# Patient Record
Sex: Male | Born: 1980 | Race: White | Hispanic: No | Marital: Single | Smoking: Never smoker
Health system: Southern US, Community
[De-identification: ages and names within clinical notes are randomized; demographics above are authoritative.]

---
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
## 2003-07-11 ENCOUNTER — Encounter: Payer: Self-pay | Admitting: Emergency Medicine

## 2003-07-11 ENCOUNTER — Emergency Department (HOSPITAL_COMMUNITY): Admission: EM | Admit: 2003-07-11 | Discharge: 2003-07-12 | Payer: Self-pay | Admitting: Emergency Medicine

## 2011-04-25 ENCOUNTER — Ambulatory Visit (HOSPITAL_COMMUNITY): Payer: 59 | Admitting: Psychiatry

## 2011-04-25 DIAGNOSIS — F329 Major depressive disorder, single episode, unspecified: Secondary | ICD-10-CM

## 2011-04-25 NOTE — Group Therapy Note (Signed)
NAMEESMOND, HINCH NO.:  [id number]  MEDICAL RECORD NO.:  [id number]  LOCATION:  [facility]                           FACILITY:  [facility]  PHYSICIAN:  Jhalen Eley T. Chin Wachter, M.D.   DATE OF BIRTH:  1981/08/08                                PROGRESS NOTE  Date of Service; 04/25/11 Patient is 30 year old Caucasian single employed man who is self- referred for seeking treatment.  The patient endorsed for past 2 years he has been feeling more depressed, tired with decreased energy and decreased concentration.  He has been taking Lexapro for past 12 years, and he feels that his current medicine is not working very well.  He told that he has been sleeping too much, and has no motivation to do anything.  Two weeks ago he attended a party in [place], and he told that all Saturday he was sleeping and did not do anything.  He admitted that he has chronic stressors.  Is working as a Airline pilot person at [employer], and that job can be stressful and difficult to achieve unrealistic goals.  However, he has no issue at the job.  Lately he has been noticing that when he comes home he feels disconnected.  Does not participate time with the family, and sometimes lying with the parents when they ask his future plans.  He denies any agitation, anger, mood swing, but told anxiety and nervousness and racing thoughts.  The patient also very self-conscious about his increased weight.  However, he remembered his weight has always been on a higher side as far as he remembers.  The patient denies any mania, hypomania, anger, psychosis or any hallucination.  He denies any active or passive suicidal thoughts. He reported no side effects of Lexapro, but he feels that his Lexapro is not working very well.  PAST PSYCHIATRIC HISTORY: Patient told that he has been taking Lexapro for past 12 years which was started by his primary care doctor when he endorsed decreased energy and decreased concentration.  He  denies any previous psychiatric inpatient treatment or previous suicidal attempt.  He has seen psychiatrist 4 years ago in [place], Dr. Piedad Climes, but recently his primary care doctor has been managing his medication.  FAMILY HISTORY: Patient endorsed both his parents are depressed.  His father takes Effexor, but he does not remember his mother's antidepressant.  PSYCHOSOCIAL HISTORY: Patient was born in [place], [place].  However, family moved to different states due to father's job.  For past 6 years they have been living in [place].  Patient denies any history of physical, sexual, verbal or emotional abuse.  He is currently in a relationship for past 4 months, though he is very cautious and tense about his relationship due to ongoing his depression and afraid that he may lie to his girlfriend also.  Patient has no children.  He is living with his parents.  LEGAL HISTORY: The patient endorsed recently he was accused for identity theft charges which is, however, now dismissed, but that caused a lot of embarrassment in his life.  Patient told that his previous girlfriend accuse him for these charges when both of them are  trying to fix the house.  Patient told that charge has recently caused a lot of stress in his life.  EDUCATION/WORK HISTORY: Patient has high school with 3 years of college.  He has been currently working as a Immunologist at [employer].  ALCOHOL AND SUBSTANCE ABUSE HISTORY: Patient denies any illegal substances.  However, endorsed history of drinking on social occasion.  His last drink was 2 weeks ago when he attended a wedding in [place].  Patient admitted history of intoxication, but denies any history of tremors, seizures, blackout or any withdrawal symptoms.  MEDICAL HISTORY: Patient does not have any physical or lab work in past 4 years.  His primary care doctor is Dr. Elvis Coil.  Recently he saw his primary care doctor for cold  symptoms.  He never had lab test for thyroid studies.  He is moderately obese.  His weight is 297.  MENTAL STATUS EXAMINATION: Patient is very pleasant obese man who is well-groomed, well-dressed. Male good eye contact.  His speech is soft, clear, and coherent.  His thought process was logical, linear, and goal-directed.  He described his mood as anxious and depressed, and affect was constricted.  However, he denies any active or passive suicidal thinking, homicidal thinking or any hallucination.  There was no psychosis present.  There was no delusions or obsession present.  His attention and concentration were okay.  His fund of knowledge was good.  He is alert and oriented x3. His insight, judgment, impulse control were okay.  DIAGNOSES: AXIS I:  Depressive disorder not otherwise specified. AXIS II:  Deferred. AXIS III:  Obesity. AXIS IV:  Mild to moderate. AXIS V:  65-70   PLAN: At this time patient is feeling that his current medicine is not working.  We talked about adding the Wellbutrin, and gradually tapering the Lexapro and just continuing Wellbutrin on a regular basis.  We will start the Wellbutrin 150 along with the Lexapro for now.  I explained the risks and benefits of medication in detail especially no alcohol with the psychotropic medication.  We explained the side effects, dependency and interaction with alcohol and psychotropic medication.  I also talked to him to get basic lab studies including the TSH to rule out any organic pathology.  We also talked about seeing a therapist for increased coping and social skills which he agreed.  We will schedule the appointment with a therapist in this office.  I will see him again in 2 weeks.  We talked about safety plan, and recommended to call us or go to local ER or call 911 immediately if he started to feel worsening of the symptoms or anytime having suicidal thinking or homicidal thinking which he acknowledged.  I will  see him again in 2 weeks.     Sandy Blouch T. Lolly Mustache, M.D.     STA/MEDQ  D:  04/25/2011  T:  04/25/2011  Job:  [id number]  Electronically Signed by Kathryne Sharper M.D. on 04/25/2011 04:36:10 PM

## 2011-05-07 ENCOUNTER — Ambulatory Visit (HOSPITAL_COMMUNITY): Payer: Self-pay | Admitting: Psychiatry

## 2011-05-09 ENCOUNTER — Encounter (HOSPITAL_COMMUNITY): Payer: 59 | Admitting: Psychiatry

## 2011-05-09 DIAGNOSIS — F329 Major depressive disorder, single episode, unspecified: Secondary | ICD-10-CM

## 2011-05-23 ENCOUNTER — Ambulatory Visit (HOSPITAL_COMMUNITY): Payer: 59 | Admitting: Licensed Clinical Social Worker

## 2011-05-24 DIAGNOSIS — F329 Major depressive disorder, single episode, unspecified: Secondary | ICD-10-CM

## 2011-06-06 ENCOUNTER — Encounter (HOSPITAL_COMMUNITY): Payer: 59 | Admitting: Psychiatry

## 2011-06-22 ENCOUNTER — Emergency Department (HOSPITAL_COMMUNITY)
Admission: EM | Admit: 2011-06-22 | Discharge: 2011-06-22 | Disposition: A | Discharge status: Other Acute Inpatient Hospital | Payer: Self-pay | Attending: Emergency Medicine | Admitting: Emergency Medicine

## 2011-06-22 ENCOUNTER — Inpatient Hospital Stay (HOSPITAL_COMMUNITY)
Admission: RE | Admit: 2011-06-22 | Discharge: 2011-06-26 | DRG: 885 | Disposition: A | Discharge status: Home / Self Care | Payer: PRIVATE HEALTH INSURANCE | Attending: Psychiatry | Admitting: Psychiatry

## 2011-06-22 DIAGNOSIS — F329 Major depressive disorder, single episode, unspecified: Secondary | ICD-10-CM | POA: Insufficient documentation

## 2011-06-22 DIAGNOSIS — F3289 Other specified depressive episodes: Secondary | ICD-10-CM | POA: Insufficient documentation

## 2011-06-22 DIAGNOSIS — F339 Major depressive disorder, recurrent, unspecified: Principal | ICD-10-CM

## 2011-06-22 DIAGNOSIS — F411 Generalized anxiety disorder: Secondary | ICD-10-CM

## 2011-06-22 DIAGNOSIS — E663 Overweight: Secondary | ICD-10-CM

## 2011-06-22 DIAGNOSIS — F429 Obsessive-compulsive disorder, unspecified: Secondary | ICD-10-CM

## 2011-06-22 DIAGNOSIS — R45851 Suicidal ideations: Secondary | ICD-10-CM

## 2011-06-22 LAB — RAPID URINE DRUG SCREEN, HOSP PERFORMED
Amphetamines: NOT DETECTED
Barbiturates: NOT DETECTED
Tetrahydrocannabinol: NOT DETECTED

## 2011-06-22 LAB — BASIC METABOLIC PANEL
BUN: 17 mg/dL (ref 6–23)
Chloride: 104 mEq/L (ref 96–112)
GFR calc non Af Amer: >60 mL/min (ref >60)
Potassium: 3.8 mEq/L (ref 3.5–5.1)

## 2011-06-22 LAB — CBC
HCT: 46.4 % (ref 39.0–52.0)
Hemoglobin: 15.8 g/dL (ref 13.0–17.0)
MCH: 28.2 pg (ref 26.0–34.0)
MCHC: 34.1 g/dL (ref 30.0–36.0)
MCV: 82.9 fL (ref 78.0–100.0)
Platelets: 237 10*3/uL (ref 150–400)
WBC: 7 10*3/uL (ref 4.0–10.5)

## 2011-06-22 LAB — ETHANOL: Alcohol, Ethyl (B): <11 mg/dL (ref 0–11)

## 2011-06-23 DIAGNOSIS — F339 Major depressive disorder, recurrent, unspecified: Secondary | ICD-10-CM

## 2011-06-23 DIAGNOSIS — F411 Generalized anxiety disorder: Secondary | ICD-10-CM

## 2011-06-23 DIAGNOSIS — F429 Obsessive-compulsive disorder, unspecified: Secondary | ICD-10-CM

## 2011-06-25 NOTE — Assessment & Plan Note (Signed)
NAMEETTORE, TREBILCOCK NO.:  [id number]  MEDICAL RECORD NO.:  [id number]  LOCATION:  0505                          FACILITY:  [facility]  PHYSICIAN:  Franchot Gallo, MD     DATE OF BIRTH:  11/15/1980  DATE OF ADMISSION:  06/22/2011 DATE OF DISCHARGE:                      PSYCHIATRIC ADMISSION ASSESSMENT   This is a 30 year old male who was voluntarily admitted on June 22, 2011.  HISTORY OF PRESENT ILLNESS:  The patient states he is here feeling very "overwhelmed".  He states that he has a history of compulsive lying, that it gets worse when he is depressed.  He feels that it has gotten out of control.  He is feeling very, very guilty, increasing anxiety, having suicidal thoughts this past week, passive suicidal thoughts, wanting not to be a burden.  He has not been sleeping well at night, having problems with self-esteem.  He reports some OCD tendencies, counting.  He feels that his medications are not as effective.  The patient is also endorsing mood swings.  He states when he feels depressed he does not feel very low.  PAST PSYCHIATRIC HISTORY:  First admission to [facility] He sees Dr. Lolly Mustache in [place] and was able to see Hassan Rowan for therapy but financially was unable to continue.  Has been on Lexapro and Wellbutrin in the past but also was having some financial problems with his medications.  SOCIAL HISTORY:  The patient is single.  He lives with his parents.  He states his mother is supportive.  His father is not.  States he does not believe in mental health problems.  FAMILY HISTORY:  None.  ALCOHOL AND DRUG HISTORY:  No alcohol or substance use.  PRIMARY CARE PROVIDER:  None.  MEDICAL PROBLEMS:  The patient denies any acute or chronic health issues.  MEDICATIONS:  He has been on Lexapro 20 mg, Wellbutrin 150 mg.  DRUG ALLERGIES:  None.  PHYSICAL EXAM:  This is an overweight young male, normally developed, no acute  distress.  He offers no complaints.  His glucose is 114.  Alcohol level less than 11.  CBC within normal limits.  Urine drug screen is negative.  MENTAL STATUS EXAM:  He is fully alert and cooperative, casually dressed, dressing in his own clothing.  He was seen with Dr. Lolly Mustache who he knows well.  Speech is clear.  Normal pace and tone.  Mood is guilty, depressed and anxious.  He does appear somewhat anxious, very pleasant and cooperative, agreeable to recommendations.  He denies any suicidal thoughts today.  Denies any hallucinations.  Cognitive function intact. His memory appears intact.  Judgment and insight appear to be good.  DIAGNOSES:  AXIS I:  Major depressive disorder rule out obsessive- compulsive disorder, mood disorder not otherwise specified. AXIS II:  Deferred. AXIS III:  No known medical conditions. AXIS IV:  Other psychosocial problems, self-esteem, chronic mental illness. AXIS V:  Current is 35.  PLAN:  To continue with the Lexapro and will add Risperdal for mood stabilization and to decrease possible ruminations.  The patient may benefit from some individual therapy.  His tentative length of stay at this time is 3-5 days.  Landry Corporal, N.P.   ______________________________ Franchot Gallo, MD    JO/MEDQ  D:  06/23/2011  T:  06/23/2011  Job:  [id number]  Electronically Signed by Limmie PatriciaP. on 06/25/2011 09:13:17 AM Electronically Signed by Franchot Gallo MD on 06/25/2011 06:34:25 PM

## 2011-06-26 LAB — TESTOSTERONE: Testosterone: 297.31 ng/dL (ref 250–890)

## 2011-06-26 LAB — TSH: TSH: 2.355 u[IU]/mL (ref 0.350–4.500)

## 2011-06-26 LAB — T3, FREE: T3, Free: 3.4 pg/mL (ref 2.3–4.2)

## 2011-06-27 LAB — SEX HORMONE BINDING GLOBULIN: Sex Hormone Binding: 17 nmol/L (ref 13–71)

## 2011-06-27 NOTE — Discharge Summary (Signed)
  NAMEDEIVI, HUCKINS NO.:  [id number]  MEDICAL RECORD NO.:  [id number]  LOCATION:  0505                          FACILITY:  [facility]  PHYSICIAN:  Franchot Gallo, MD     DATE OF BIRTH:  [birth year]  DATE OF ADMISSION:  06/22/2011 DATE OF DISCHARGE:  06/26/2011                              DISCHARGE SUMMARY   REASON FOR ADMISSION:  A 30 year old male that was admitted feeling very "overwhelmed."  He had a history of compulsive lying.  He states that this gets worse when he is depressed.  He feels he has gotten out of control, feeling very guilty, having increased anxiety suicidal thoughts, passive suicidal thoughts, wanting not to be a burden to his parents.  He also has a history of OCD tendencies.  FINAL IMPRESSION:  AXIS I:  Major depressive disorder recurrent good control.  Generalized anxiety disorder and OCD. AXIS II: Deferred. AXIS III:  No acute illnesses reported. AXIS IV: Chronic mental health issues. AXIS V: His GAF on discharge is 75.  PERTINENT LABS:  Glucose 114.  Alcohol level less than 11.  CBC within normal limits.  Urine drug screen is negative.  SIGNIFICANT FINDINGS:  The patient was admitted to the adult milieu.  We initially continued his Lexapro and added Risperdal for mood stabilization and decreased ruminations.  The patient was active and participating in groups.  We had contact the patient's mother who reported that the patient was not suicidal, that he does have history of chronic lying and court date on August 27, for the 3 felony charges.   The patient's initially felt very exhausted from his medications although he slept well.  He rated his depression 5.  He denied any psychotic symptoms.  We discontinued his Lexapro as he felt it was ineffective and started him on Prozac.  The patient reported sleeping good.  His appetite was good, having no suicidal thoughts  reporting depression a  4 on a scale of 1-10.  He has a history of  OCD. We increased his Prozac to 20 mg and obtained further labs.  On day of discharge the patient's sleep was good.  His appetite was good.  His depression had resolved rating it 0 on a scale of 1-10, adamantly denied any suicidal or homicidal thoughts or psychotic symptoms.  DISCHARGE MEDICATIONS: 1. Prozac 20 one tablet daily. 2. Risperdal 0.5 mg 1 b.i.d. 3. The patient was stop taking his Lexapro. 4. Testosterone Gel 5 grams applied daily to abdomen.  His follow-up appointment [facility] at [phone] on Tuesday on August 21 and with Dr. Lolly Mustache on Monday September 17 at 2:15.     Landry Corporal, N.P.   ______________________________ Franchot Gallo, MD    JO/MEDQ  D:  06/26/2011  T:  06/27/2011  Job:  [id number]  Electronically Signed by Limmie PatriciaP. on 06/27/2011 09:06:35 AM Electronically Signed by Franchot Gallo MD on 06/27/2011 04:05:44 PM

## 2011-07-30 ENCOUNTER — Encounter (HOSPITAL_COMMUNITY): Payer: PRIVATE HEALTH INSURANCE | Admitting: Psychiatry

## 2011-07-31 ENCOUNTER — Encounter (HOSPITAL_COMMUNITY): Payer: Self-pay | Admitting: Psychiatry

## 2011-10-19 ENCOUNTER — Ambulatory Visit (HOSPITAL_COMMUNITY): Payer: PRIVATE HEALTH INSURANCE | Admitting: Psychiatry

## 2018-12-11 ENCOUNTER — Ambulatory Visit: Payer: Self-pay | Admitting: Internal Medicine

## 2018-12-11 DIAGNOSIS — Z0289 Encounter for other administrative examinations: Secondary | ICD-10-CM

## 2020-01-30 ENCOUNTER — Other Ambulatory Visit: Payer: Self-pay

## 2020-01-30 ENCOUNTER — Encounter (HOSPITAL_COMMUNITY): Payer: Self-pay | Admitting: Emergency Medicine

## 2020-01-30 ENCOUNTER — Emergency Department (HOSPITAL_COMMUNITY)
Admission: EM | Admit: 2020-01-30 | Discharge: 2020-01-31 | Disposition: A | Discharge status: Home / Self Care | Payer: Self-pay | Attending: Emergency Medicine | Admitting: Emergency Medicine

## 2020-01-30 DIAGNOSIS — K55069 Acute infarction of intestine, part and extent unspecified: Secondary | ICD-10-CM | POA: Insufficient documentation

## 2020-01-30 MED ORDER — SODIUM CHLORIDE 0.9% FLUSH
3.0000 mL | Freq: Once | INTRAVENOUS | Status: AC
  Administered 2020-01-31: 05:00:00 3 mL via INTRAVENOUS

## 2020-01-30 NOTE — ED Triage Notes (Signed)
Patient reports right lateral abdominal pain for 2 days , denies emesis or diarrhea , no fever or chills .

## 2020-01-31 ENCOUNTER — Emergency Department (HOSPITAL_COMMUNITY): Payer: Self-pay

## 2020-01-31 LAB — CBC
HCT: 45.7 % (ref 39.0–52.0)
Hemoglobin: 14 g/dL (ref 13.0–17.0)
MCH: 24.9 pg — ABNORMAL LOW (ref 26.0–34.0)
MCHC: 30.6 g/dL (ref 30.0–36.0)
MCV: 81.3 fL (ref 80.0–100.0)
Platelets: 347 10*3/uL (ref 150–400)
RBC: 5.62 MIL/uL (ref 4.22–5.81)
RDW: 15.4 % (ref 11.5–15.5)
WBC: 9.6 10*3/uL (ref 4.0–10.5)
nRBC: 0 % (ref 0.0–0.2)

## 2020-01-31 LAB — COMPREHENSIVE METABOLIC PANEL
ALT: 44 U/L (ref 0–44)
AST: 28 U/L (ref 15–41)
Albumin: 3.9 g/dL (ref 3.5–5.0)
Alkaline Phosphatase: 49 U/L (ref 38–126)
Anion gap: 10 (ref 5–15)
BUN: 17 mg/dL (ref 6–20)
CO2: 25 mmol/L (ref 22–32)
Calcium: 9 mg/dL (ref 8.9–10.3)
Chloride: 103 mmol/L (ref 98–111)
Creatinine, Ser: 0.99 mg/dL (ref 0.61–1.24)
GFR calc Af Amer: >60 mL/min (ref >60)
GFR calc non Af Amer: >60 mL/min (ref >60)
Glucose, Bld: 108 mg/dL — ABNORMAL HIGH (ref 70–99)
Potassium: 3.5 mmol/L (ref 3.5–5.1)
Sodium: 138 mmol/L (ref 135–145)
Total Bilirubin: 0.7 mg/dL (ref 0.3–1.2)
Total Protein: 7.5 g/dL (ref 6.5–8.1)

## 2020-01-31 LAB — URINALYSIS, ROUTINE W REFLEX MICROSCOPIC
Bacteria, UA: NONE SEEN
Bilirubin Urine: NEGATIVE
Glucose, UA: NEGATIVE mg/dL
Hgb urine dipstick: NEGATIVE
Ketones, ur: NEGATIVE mg/dL
Nitrite: NEGATIVE
Protein, ur: NEGATIVE mg/dL
Specific Gravity, Urine: 1.014 (ref 1.005–1.030)
pH: 6 (ref 5.0–8.0)

## 2020-01-31 LAB — LIPASE, BLOOD: Lipase: 33 U/L (ref 11–51)

## 2020-01-31 MED ORDER — KETOROLAC TROMETHAMINE 30 MG/ML IJ SOLN
30.0000 mg | Freq: Once | INTRAMUSCULAR | Status: AC
  Administered 2020-01-31: 30 mg via INTRAVENOUS
  Filled 2020-01-31: qty 1

## 2020-01-31 MED ORDER — CEFTRIAXONE SODIUM 500 MG IJ SOLR
500.0000 mg | Freq: Once | INTRAMUSCULAR | Status: DC
  Filled 2020-01-31: qty 500

## 2020-01-31 MED ORDER — STERILE WATER FOR INJECTION IJ SOLN
INTRAMUSCULAR | Status: AC
  Filled 2020-01-31: qty 10

## 2020-01-31 NOTE — Discharge Instructions (Addendum)
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  This is usually self-limiting and can be managed at home with pain medication such as Tylenol and Motrin.    If your symptoms change or worsen, please return to the ER.

## 2020-01-31 NOTE — ED Provider Notes (Signed)
[facility] EMERGENCY DEPARTMENT Provider Note   CSN: [id number] Arrival date & time: 01/30/20  2319     History Chief Complaint  Patient presents with  . Abdominal Pain    Zachary Glover is a 39 y.o. male.  Patient presents to the emergency department with a chief complaint of right-sided flank pain.  He states that the symptoms started about 2 days ago.  He states that the pain waxes and wanes in severity.  He denies any pain with changes in position.  However, he states that he has been unable to find a comfortable position.  He denies any fever, chills, nausea, vomiting.  Reports having normal bowel movements and denies any dysuria or hematuria.  Denies any history of kidney stones.  Denies any prior abdominal surgeries.  The history is provided by the patient. No language interpreter was used.       History reviewed. No pertinent past medical history.  There are no problems to display for this patient.   History reviewed. No pertinent surgical history.     No family history on file.  Social History   Tobacco Use  . Smoking status: Never Smoker  . Smokeless tobacco: Never Used  Substance Use Topics  . Alcohol use: Yes  . Drug use: Never    Home Medications Prior to Admission medications   Not on File    Allergies    Patient has no known allergies.  Review of Systems   Review of Systems  All other systems reviewed and are negative.   Physical Exam Updated Vital Signs BP (!) 164/103   Pulse (!) 112   Temp 98.7 F (37.1 C) (Oral)   Resp 16   Ht 5\' 11"  (1.803 m)   Wt (!) 160 kg   SpO2 97%   BMI 49.20 kg/m   Physical Exam Vitals and nursing note reviewed.  Constitutional:      Appearance: He is well-developed.  HENT:     Head: Normocephalic and atraumatic.  Eyes:     Conjunctiva/sclera: Conjunctivae normal.  Cardiovascular:     Rate and Rhythm: Normal rate and regular rhythm.     Heart sounds: No murmur.  Pulmonary:       Effort: Pulmonary effort is normal. No respiratory distress.     Breath sounds: Normal breath sounds.  Abdominal:     Palpations: Abdomen is soft.     Tenderness: There is no abdominal tenderness.  Musculoskeletal:        General: Normal range of motion.     Cervical back: Neck supple.  Skin:    General: Skin is warm and dry.  Neurological:     General: No focal deficit present.     Mental Status: He is alert and oriented to person, place, and time.  Psychiatric:        Mood and Affect: Mood normal.        Behavior: Behavior normal.     ED Results / Procedures / Treatments   Labs (all labs ordered are listed, but only abnormal results are displayed) Labs Reviewed  COMPREHENSIVE METABOLIC PANEL - Abnormal; Notable for the following components:      Result Value   Glucose, Bld 108 (*)    All other components within normal limits  CBC - Abnormal; Notable for the following components:   MCH 24.9 (*)    All other components within normal limits  URINALYSIS, ROUTINE W REFLEX MICROSCOPIC - Abnormal; Notable for the  following components:   Leukocytes,Ua TRACE (*)    All other components within normal limits  LIPASE, BLOOD    EKG None  Radiology CT Renal Stone Study  Result Date: 01/31/2020 CLINICAL DATA:  Initial evaluation for acute right abdominal pain, stone suspected. EXAM: CT ABDOMEN AND PELVIS WITHOUT CONTRAST TECHNIQUE: Multidetector CT imaging of the abdomen and pelvis was performed following the standard protocol without IV contrast. COMPARISON:  None available. FINDINGS: Lower chest: Mild scattered subsegmental atelectatic changes seen within the visualized lung bases. Visualized lungs are otherwise clear. Hepatobiliary: Diffuse hypoattenuation liver consistent with steatosis. Liver otherwise unremarkable. Gallbladder within normal limits. No biliary dilatation. Pancreas: Pancreas within normal limits. Spleen: Spleen within normal limits. Adrenals/Urinary Tract:  Adrenal glands are normal. Kidneys equal in size without nephrolithiasis or hydronephrosis. No radiopaque calculi seen along the course of either renal collecting system. No hydroureter. Partially distended bladder within normal limits. No layering stones within the bladder lumen. Stomach/Bowel: Stomach within normal limits. No evidence for bowel obstruction. There is hazy inflammatory fat stranding seen adjacent to the ascending colon within the right abdomen, immediately adjacent to the inferior aspect of the liver (series 6, image 58). The adjacent ascending colon is relatively normal in appearance without associated wall thickening or other abnormality. Appendix is visualized inferiorly within the right lower quadrant and is of normal caliber and appearance without evidence for acute appendicitis. Finding favored to reflect sequelae of an acute omental infarct. No other acute inflammatory changes seen elsewhere about the bowels. Vascular/Lymphatic: Intra-abdominal aorta of normal caliber. Evaluation for vascular patency limited by lack of IV contrast. No adenopathy. Reproductive: Prostate within normal limits. Other: No free air or fluid. Small fat containing paraumbilical and bilateral inguinal hernias noted without associated inflammation. Musculoskeletal: No acute osseous abnormality. No discrete or worrisome osseous lesions. Mild levoscoliosis noted. IMPRESSION: 1. 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 2. No other acute intra-abdominal or pelvic process. Normal appendix. 3. No CT evidence for nephrolithiasis or obstructive uropathy. 4. Hepatic steatosis. Electronically Signed   By: Jeannine Boga M.D.   On: 01/31/2020 05:02    Procedures Procedures (including critical care time)  Medications Ordered in ED Medications  sodium chloride flush (NS) 0.9 % injection 3 mL (has no administration  in time range)  ketorolac (TORADOL) 30 MG/ML injection 30 mg (has no administration in time range)    ED Course  I have reviewed the triage vital signs and the nursing notes.  Pertinent labs & imaging results that were available during my care of the patient were reviewed by me and considered in my medical decision making (see chart for details).    MDM Rules/Calculators/A&P                     Patient with right flank pain.  Laboratory work-up is reassuring.  He does not have any significant leukocytosis.  Urinalysis is negative for hemoglobin.  He has no focal right lower quadrant tenderness.  Doubt appendicitis.  There is concern for possible kidney stone.  Will check CT renal study.  CT shows no evidence of kidney stone, but is consistent with omental infarct.  I discussed this with the patient.  Will manage with over-the-counter pain medications and watchful waiting.  Patient understands and agrees with plan.   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

## 2020-01-31 NOTE — ED Notes (Signed)
Patient verbalizes understanding of discharge instructions. Opportunity for questioning and answers were provided. Armband removed by staff, pt discharged from ED. Pt. ambulatory and discharged home.  

## 2020-07-18 IMAGING — CT CT RENAL STONE PROTOCOL
2 of 4 series · 16 of 46 positions shown, 18 images · non-contrast
Comparison: None available.

CLINICAL DATA: Initial evaluation for acute right abdominal pain,
stone suspected.

EXAM:
CT ABDOMEN AND PELVIS WITHOUT CONTRAST
TECHNIQUE: Multidetector CT imaging of the abdomen and pelvis was performed
following the standard protocol without IV contrast.

[Series 3: renal stone 5.0 · axial · 0.98mm/px · z∈[-1135,-630]mm · 13 of 111 slices shown, 15 images]
[im 5/111  soft-tissue]
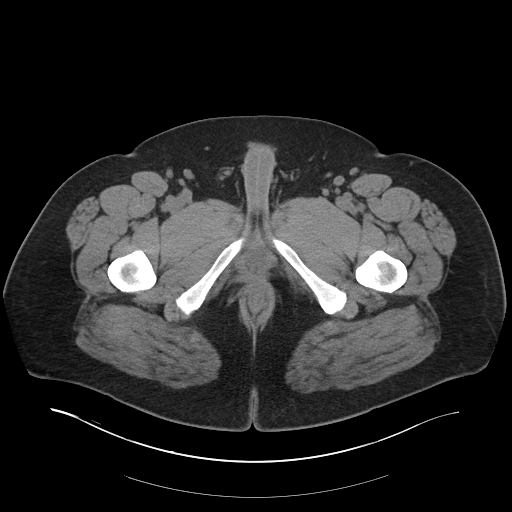
[im 5/111  bone]
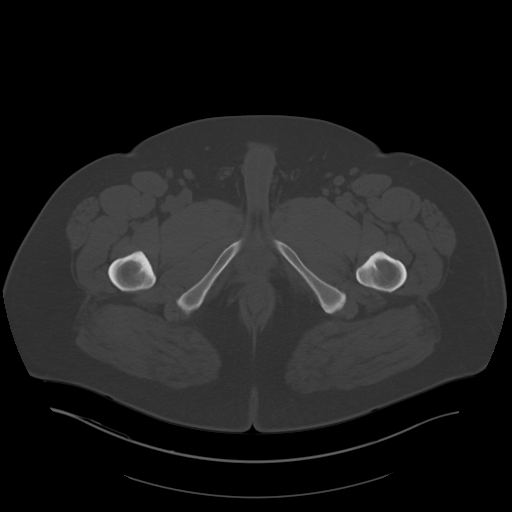
[im 14/111  soft-tissue]
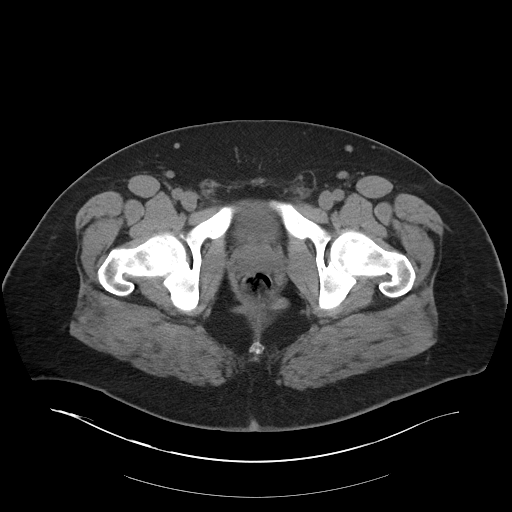
[im 23/111  soft-tissue]
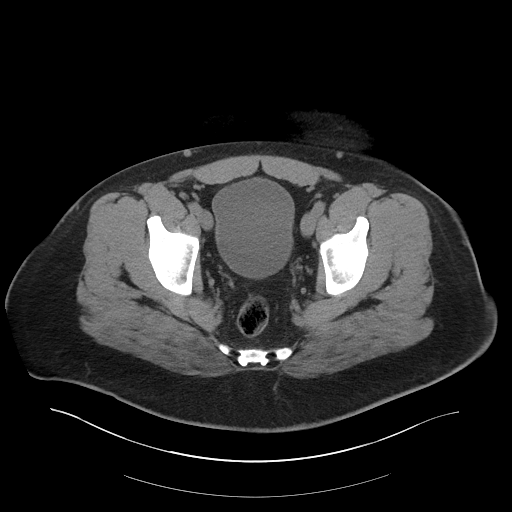
[im 31/111  soft-tissue]
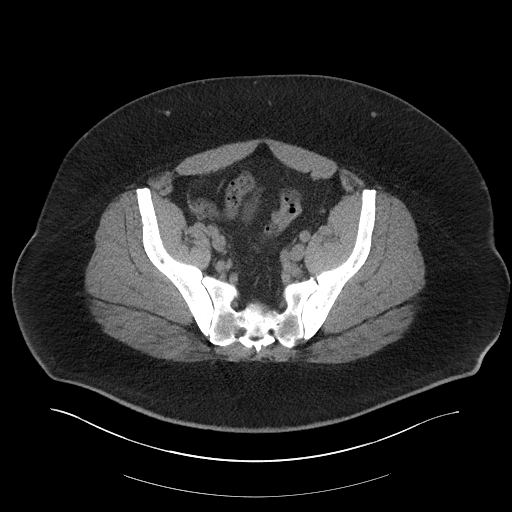
[im 40/111  soft-tissue]
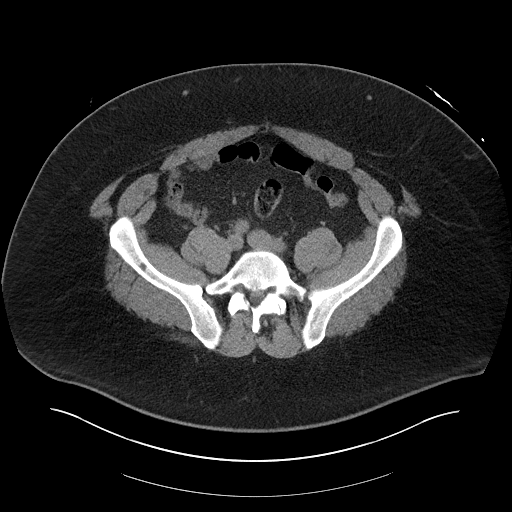
[im 49/111  soft-tissue]
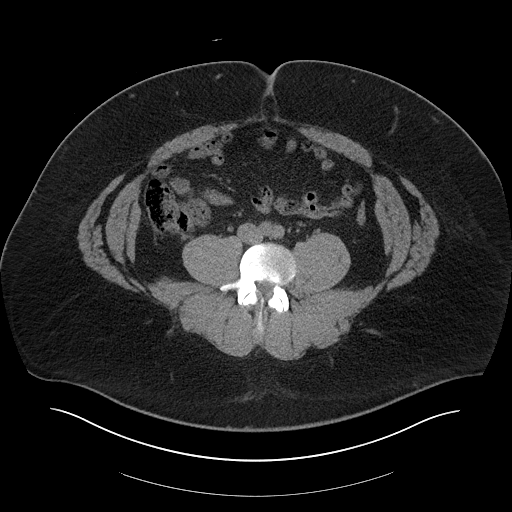
[im 58/111  soft-tissue]
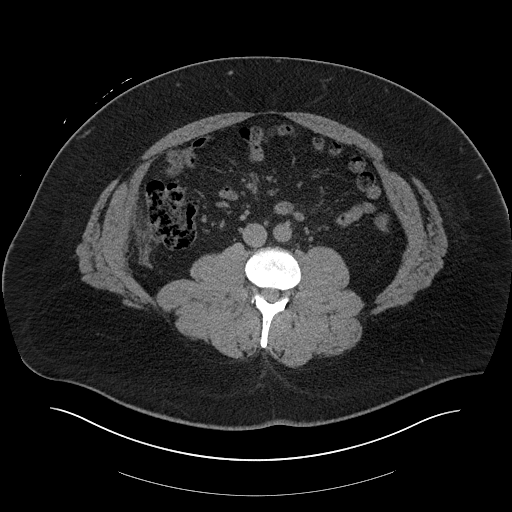
[im 62/111  soft-tissue]
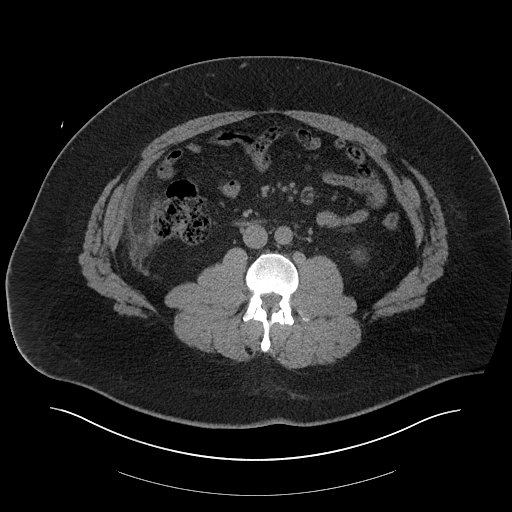
[im 71/111  soft-tissue]
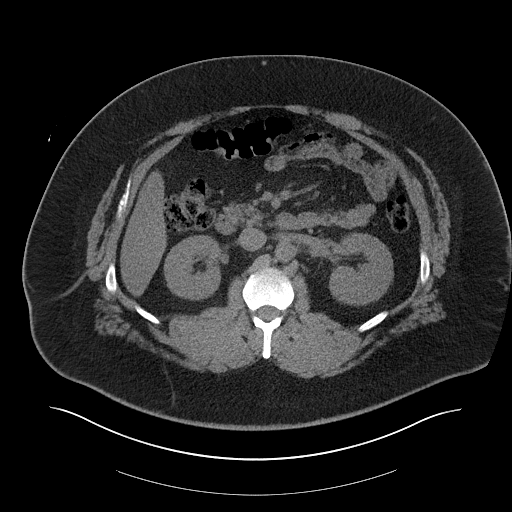
[im 71/111  bone]
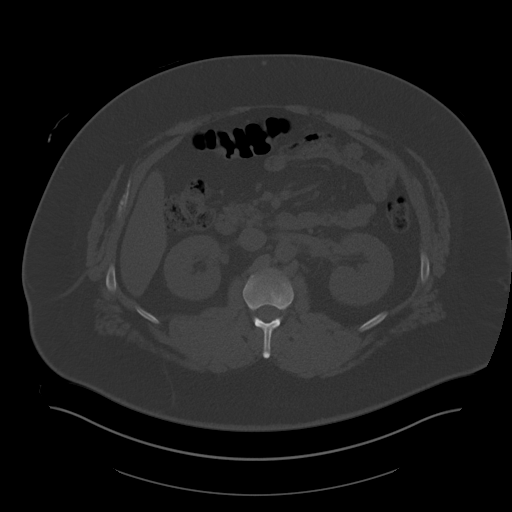
[im 80/111  soft-tissue]
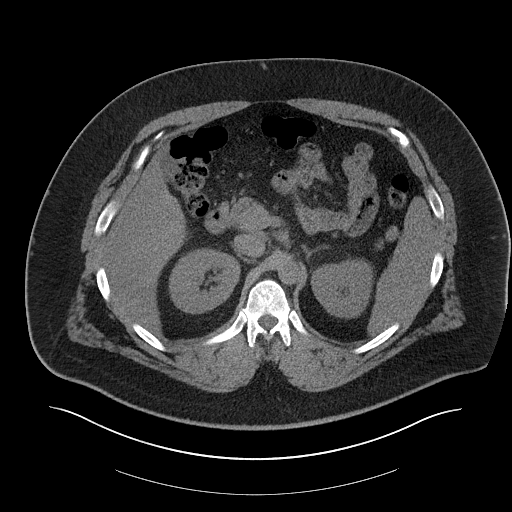
[im 89/111  soft-tissue]
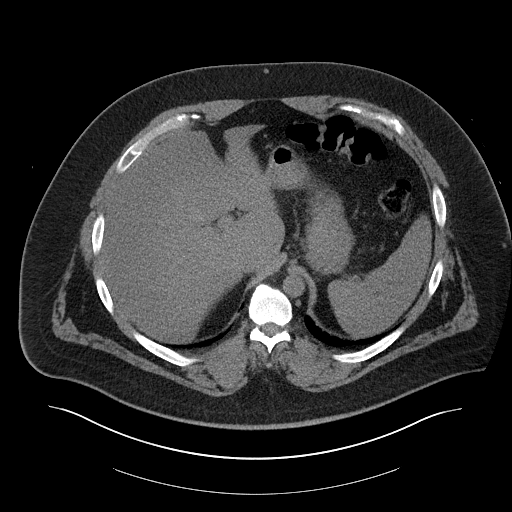
[im 97/111  soft-tissue]
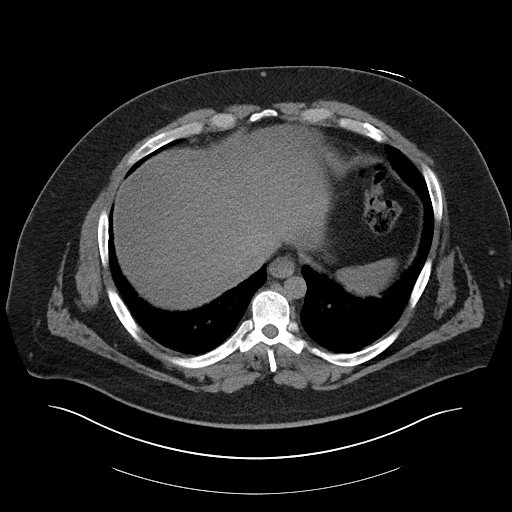
[im 106/111  soft-tissue]
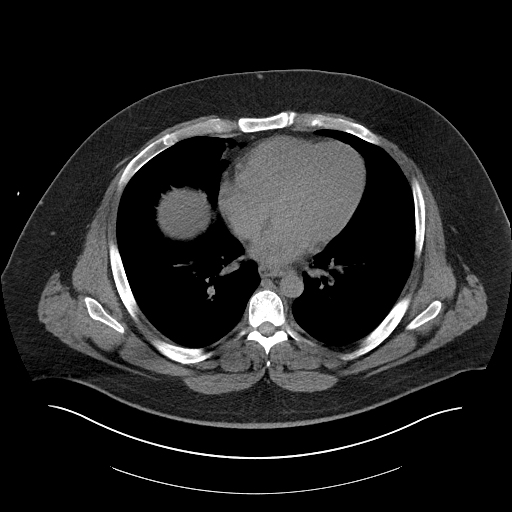

[Series 6: renal stone 3.0 cor · coronal · 1.11mm/px · 3 of 139 slices shown]
[im 47/139  soft-tissue]
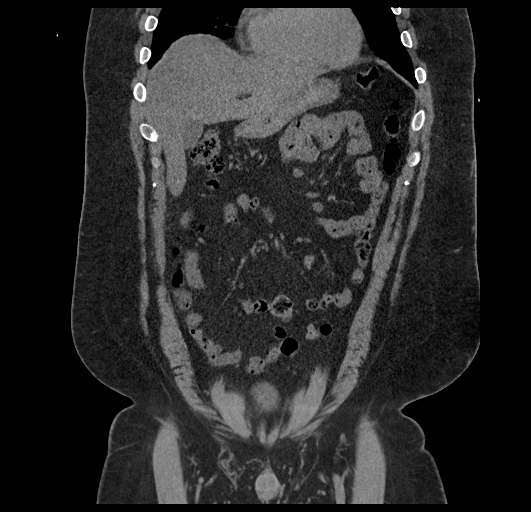
[im 62/139  soft-tissue]
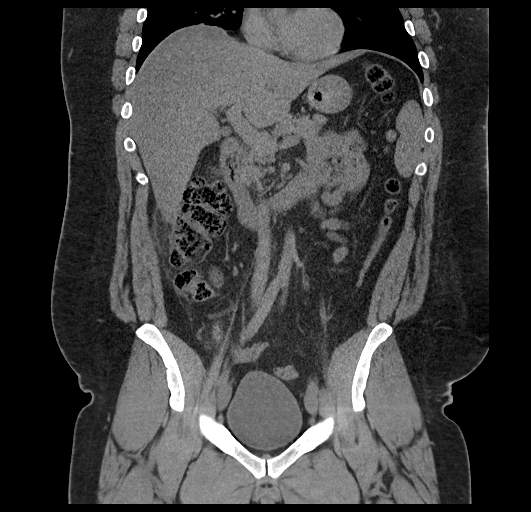
[im 77/139  soft-tissue]
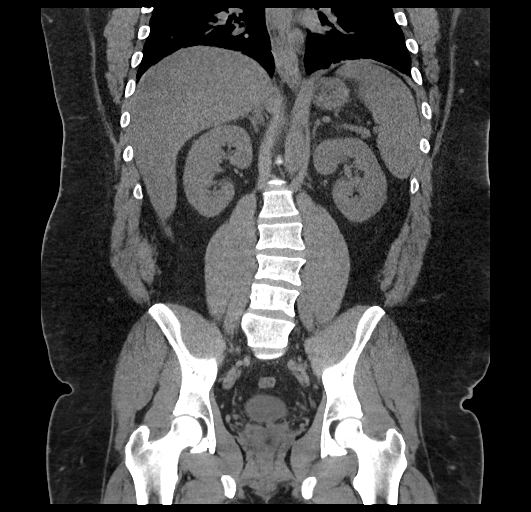

[16 of 46 positions shown; findings below may reference images not displayed]

FINDINGS: Lower chest: Mild scattered subsegmental atelectatic changes seen
within the visualized lung bases. Visualized lungs are otherwise
clear.

Hepatobiliary: Diffuse hypoattenuation liver consistent with
steatosis. Liver otherwise unremarkable. Gallbladder within normal
limits. No biliary dilatation.

Pancreas: Pancreas within normal limits.

Spleen: Spleen within normal limits.

Adrenals/Urinary Tract: Adrenal glands are normal. Kidneys equal in
size without nephrolithiasis or hydronephrosis. No radiopaque
calculi seen along the course of either renal collecting system. No
hydroureter. Partially distended bladder within normal limits. No
layering stones within the bladder lumen.

Stomach/Bowel: Stomach within normal limits. No evidence for bowel
obstruction. There is hazy inflammatory fat stranding seen adjacent
to the ascending colon within the right abdomen, immediately
adjacent to the inferior aspect of the liver (series 6, image 58).
The adjacent ascending colon is relatively normal in appearance
without associated wall thickening or other abnormality. Appendix is
visualized inferiorly within the right lower quadrant and is of
normal caliber and appearance without evidence for acute
appendicitis. Finding favored to reflect sequelae of an acute
omental infarct. No other acute inflammatory changes seen elsewhere
about the bowels.

Vascular/Lymphatic: Intra-abdominal aorta of normal caliber.
Evaluation for vascular patency limited by lack of IV contrast. No
adenopathy.

Reproductive: Prostate within normal limits.

Other: No free air or fluid. Small fat containing paraumbilical and
bilateral inguinal hernias noted without associated inflammation.

Musculoskeletal: No acute osseous abnormality. No discrete or
worrisome osseous lesions. Mild levoscoliosis noted.
IMPRESSION: 1. Hazy inflammatory fat stranding adjacent to the ascending colon
within the right abdomen, favored to reflect sequelae of an acute
omental infarct.
2. No other acute intra-abdominal or pelvic process. Normal
appendix.
3. No CT evidence for nephrolithiasis or obstructive uropathy.
4. Hepatic steatosis.

## 2021-12-19 ENCOUNTER — Ambulatory Visit (HOSPITAL_BASED_OUTPATIENT_CLINIC_OR_DEPARTMENT_OTHER): Payer: BC Managed Care – PPO | Admitting: Family Medicine

## 2022-01-19 ENCOUNTER — Encounter (HOSPITAL_BASED_OUTPATIENT_CLINIC_OR_DEPARTMENT_OTHER): Payer: Self-pay | Admitting: Family Medicine

## 2022-08-13 DIAGNOSIS — Z23 Encounter for immunization: Secondary | ICD-10-CM | POA: Diagnosis not present

## 2022-08-13 DIAGNOSIS — Z051 Observation and evaluation of newborn for suspected infectious condition ruled out: Secondary | ICD-10-CM | POA: Diagnosis not present

## 2023-02-04 ENCOUNTER — Other Ambulatory Visit (HOSPITAL_COMMUNITY): Payer: Self-pay

## 2023-02-04 MED ORDER — WEGOVY 0.25 MG/0.5ML ~~LOC~~ SOAJ
0.2500 mg | SUBCUTANEOUS | 0 refills | Status: AC
  Filled 2023-02-04 – 2023-02-11 (×3): qty 2, 28d supply, fill #0

## 2023-02-05 ENCOUNTER — Other Ambulatory Visit (HOSPITAL_COMMUNITY): Payer: Self-pay

## 2023-02-08 ENCOUNTER — Other Ambulatory Visit (HOSPITAL_COMMUNITY): Payer: Self-pay

## 2023-02-11 ENCOUNTER — Other Ambulatory Visit (HOSPITAL_COMMUNITY): Payer: Self-pay

## 2023-02-13 ENCOUNTER — Other Ambulatory Visit (HOSPITAL_COMMUNITY): Payer: Self-pay

## 2023-02-13 DIAGNOSIS — I10 Essential (primary) hypertension: Secondary | ICD-10-CM | POA: Diagnosis not present

## 2023-02-13 DIAGNOSIS — Z130 Encounter for screening for diseases of the blood and blood-forming organs and certain disorders involving the immune mechanism: Secondary | ICD-10-CM | POA: Diagnosis not present

## 2023-02-13 DIAGNOSIS — Z1330 Encounter for screening examination for mental health and behavioral disorders, unspecified: Secondary | ICD-10-CM | POA: Diagnosis not present

## 2023-02-13 DIAGNOSIS — Z1329 Encounter for screening for other suspected endocrine disorder: Secondary | ICD-10-CM | POA: Diagnosis not present

## 2023-02-13 DIAGNOSIS — G4719 Other hypersomnia: Secondary | ICD-10-CM | POA: Diagnosis not present

## 2023-02-13 DIAGNOSIS — Z13220 Encounter for screening for lipoid disorders: Secondary | ICD-10-CM | POA: Diagnosis not present

## 2023-02-13 MED ORDER — OLMESARTAN MEDOXOMIL 20 MG PO TABS
20.0000 mg | ORAL_TABLET | Freq: Every day | ORAL | 5 refills | Status: AC
  Filled 2023-02-13: qty 30, 30d supply, fill #0
  Filled 2023-03-19 – 2023-04-05 (×3): qty 30, 30d supply, fill #1
  Filled 2023-05-02: qty 30, 30d supply, fill #2

## 2023-02-28 DIAGNOSIS — R0681 Apnea, not elsewhere classified: Secondary | ICD-10-CM | POA: Diagnosis not present

## 2023-03-01 DIAGNOSIS — R0681 Apnea, not elsewhere classified: Secondary | ICD-10-CM | POA: Diagnosis not present

## 2023-03-04 DIAGNOSIS — G4733 Obstructive sleep apnea (adult) (pediatric): Secondary | ICD-10-CM | POA: Diagnosis not present

## 2023-03-07 ENCOUNTER — Other Ambulatory Visit (HOSPITAL_COMMUNITY): Payer: Self-pay

## 2023-03-07 MED ORDER — WEGOVY 0.5 MG/0.5ML ~~LOC~~ SOAJ
0.5000 mg | SUBCUTANEOUS | 0 refills | Status: AC
  Filled 2023-03-07: qty 2, 28d supply, fill #0

## 2023-03-08 DIAGNOSIS — G4733 Obstructive sleep apnea (adult) (pediatric): Secondary | ICD-10-CM | POA: Diagnosis not present

## 2023-03-19 DIAGNOSIS — J302 Other seasonal allergic rhinitis: Secondary | ICD-10-CM | POA: Diagnosis not present

## 2023-03-19 DIAGNOSIS — J029 Acute pharyngitis, unspecified: Secondary | ICD-10-CM | POA: Diagnosis not present

## 2023-04-02 ENCOUNTER — Other Ambulatory Visit (HOSPITAL_COMMUNITY): Payer: Self-pay

## 2023-04-05 ENCOUNTER — Other Ambulatory Visit (HOSPITAL_COMMUNITY): Payer: Self-pay

## 2023-04-07 DIAGNOSIS — G4733 Obstructive sleep apnea (adult) (pediatric): Secondary | ICD-10-CM | POA: Diagnosis not present

## 2023-04-09 ENCOUNTER — Other Ambulatory Visit (HOSPITAL_COMMUNITY): Payer: Self-pay

## 2023-04-09 MED ORDER — WEGOVY 1 MG/0.5ML ~~LOC~~ SOAJ
1.0000 mg | SUBCUTANEOUS | 0 refills | Status: AC
  Filled 2023-04-09: qty 2, 28d supply, fill #0

## 2023-04-10 ENCOUNTER — Other Ambulatory Visit (HOSPITAL_COMMUNITY): Payer: Self-pay

## 2023-05-02 ENCOUNTER — Other Ambulatory Visit (HOSPITAL_COMMUNITY): Payer: Self-pay

## 2023-05-02 MED ORDER — WEGOVY 1.7 MG/0.75ML ~~LOC~~ SOAJ
1.7000 mg | SUBCUTANEOUS | 0 refills | Status: AC
  Filled 2023-05-02: qty 3, 28d supply, fill #0

## 2023-05-03 ENCOUNTER — Other Ambulatory Visit (HOSPITAL_COMMUNITY): Payer: Self-pay

## 2023-05-03 MED ORDER — MOUNJARO 5 MG/0.5ML ~~LOC~~ SOAJ
5.0000 mg | SUBCUTANEOUS | 0 refills | Status: AC
  Filled 2023-05-03: qty 4, 56d supply, fill #0

## 2023-05-06 ENCOUNTER — Other Ambulatory Visit (HOSPITAL_COMMUNITY): Payer: Self-pay

## 2023-05-14 ENCOUNTER — Other Ambulatory Visit (HOSPITAL_COMMUNITY): Payer: Self-pay

## 2023-05-15 ENCOUNTER — Other Ambulatory Visit (HOSPITAL_COMMUNITY): Payer: Self-pay

## 2023-05-17 ENCOUNTER — Other Ambulatory Visit (HOSPITAL_COMMUNITY): Payer: Self-pay

## 2023-12-09 DIAGNOSIS — Z0000 Encounter for general adult medical examination without abnormal findings: Secondary | ICD-10-CM | POA: Diagnosis not present

## 2023-12-09 DIAGNOSIS — H68109 Unspecified obstruction of Eustachian tube, unspecified ear: Secondary | ICD-10-CM | POA: Diagnosis not present

## 2023-12-09 DIAGNOSIS — Z125 Encounter for screening for malignant neoplasm of prostate: Secondary | ICD-10-CM | POA: Diagnosis not present

## 2023-12-09 DIAGNOSIS — G4733 Obstructive sleep apnea (adult) (pediatric): Secondary | ICD-10-CM | POA: Diagnosis not present

## 2023-12-09 DIAGNOSIS — R5383 Other fatigue: Secondary | ICD-10-CM | POA: Diagnosis not present

## 2023-12-09 DIAGNOSIS — Z13220 Encounter for screening for lipoid disorders: Secondary | ICD-10-CM | POA: Diagnosis not present

## 2023-12-17 DIAGNOSIS — Z0000 Encounter for general adult medical examination without abnormal findings: Secondary | ICD-10-CM | POA: Diagnosis not present

## 2023-12-17 DIAGNOSIS — R030 Elevated blood-pressure reading, without diagnosis of hypertension: Secondary | ICD-10-CM | POA: Diagnosis not present

## 2024-01-09 ENCOUNTER — Other Ambulatory Visit (HOSPITAL_COMMUNITY): Payer: Self-pay

## 2024-01-09 DIAGNOSIS — J309 Allergic rhinitis, unspecified: Secondary | ICD-10-CM | POA: Diagnosis not present

## 2024-01-09 DIAGNOSIS — G4733 Obstructive sleep apnea (adult) (pediatric): Secondary | ICD-10-CM | POA: Diagnosis not present

## 2024-01-09 DIAGNOSIS — H6592 Unspecified nonsuppurative otitis media, left ear: Secondary | ICD-10-CM | POA: Diagnosis not present

## 2024-01-09 DIAGNOSIS — I10 Essential (primary) hypertension: Secondary | ICD-10-CM | POA: Diagnosis not present

## 2024-01-09 MED ORDER — MONTELUKAST SODIUM 10 MG PO TABS
10.0000 mg | ORAL_TABLET | Freq: Every day | ORAL | 3 refills | Status: AC
  Filled 2024-01-09: qty 30, 30d supply, fill #0
  Filled 2024-02-25: qty 30, 30d supply, fill #1

## 2024-01-09 MED ORDER — VALSARTAN-HYDROCHLOROTHIAZIDE 160-12.5 MG PO TABS
1.0000 | ORAL_TABLET | Freq: Every day | ORAL | 0 refills | Status: AC
  Filled 2024-01-09: qty 30, 30d supply, fill #0

## 2024-01-09 MED ORDER — WEGOVY 0.25 MG/0.5ML ~~LOC~~ SOAJ
0.2500 mg | SUBCUTANEOUS | 2 refills | Status: AC
  Filled 2024-01-09: qty 2, 28d supply, fill #0

## 2024-01-21 ENCOUNTER — Other Ambulatory Visit (HOSPITAL_COMMUNITY): Payer: Self-pay

## 2024-02-06 ENCOUNTER — Other Ambulatory Visit (HOSPITAL_COMMUNITY): Payer: Self-pay

## 2024-02-06 ENCOUNTER — Encounter (HOSPITAL_COMMUNITY): Payer: Self-pay

## 2024-02-06 DIAGNOSIS — I10 Essential (primary) hypertension: Secondary | ICD-10-CM | POA: Diagnosis not present

## 2024-02-06 DIAGNOSIS — J309 Allergic rhinitis, unspecified: Secondary | ICD-10-CM | POA: Diagnosis not present

## 2024-02-06 DIAGNOSIS — G4733 Obstructive sleep apnea (adult) (pediatric): Secondary | ICD-10-CM | POA: Diagnosis not present

## 2024-02-06 MED ORDER — VALSARTAN-HYDROCHLOROTHIAZIDE 160-12.5 MG PO TABS
1.0000 | ORAL_TABLET | Freq: Every day | ORAL | 3 refills | Status: AC
  Filled 2024-02-06 – 2024-02-25 (×2): qty 30, 30d supply, fill #0

## 2024-02-06 MED ORDER — MONTELUKAST SODIUM 10 MG PO TABS
10.0000 mg | ORAL_TABLET | Freq: Every day | ORAL | 3 refills | Status: AC
  Filled 2024-02-06: qty 30, 30d supply, fill #0

## 2024-02-06 MED ORDER — WEGOVY 0.5 MG/0.5ML ~~LOC~~ SOAJ
0.5000 mg | SUBCUTANEOUS | 1 refills | Status: AC
  Filled 2024-02-06 (×2): qty 2, 28d supply, fill #0

## 2024-02-18 ENCOUNTER — Other Ambulatory Visit (HOSPITAL_COMMUNITY): Payer: Self-pay

## 2024-02-25 ENCOUNTER — Other Ambulatory Visit (HOSPITAL_COMMUNITY): Payer: Self-pay

## 2024-06-29 ENCOUNTER — Other Ambulatory Visit (HOSPITAL_COMMUNITY): Payer: Self-pay

## 2024-06-29 DIAGNOSIS — Z0000 Encounter for general adult medical examination without abnormal findings: Secondary | ICD-10-CM | POA: Diagnosis not present

## 2024-06-29 DIAGNOSIS — E66813 Obesity, class 3: Secondary | ICD-10-CM | POA: Diagnosis not present

## 2024-06-29 DIAGNOSIS — Z1330 Encounter for screening examination for mental health and behavioral disorders, unspecified: Secondary | ICD-10-CM | POA: Diagnosis not present

## 2024-06-29 DIAGNOSIS — Z6841 Body Mass Index (BMI) 40.0 and over, adult: Secondary | ICD-10-CM | POA: Diagnosis not present

## 2024-06-29 DIAGNOSIS — R42 Dizziness and giddiness: Secondary | ICD-10-CM | POA: Diagnosis not present

## 2024-06-29 DIAGNOSIS — I10 Essential (primary) hypertension: Secondary | ICD-10-CM | POA: Diagnosis not present

## 2024-06-29 MED ORDER — DEXCOM G7 RECEIVER DEVI
0 refills | Status: AC
  Filled 2024-06-29: qty 1, 30d supply, fill #0

## 2024-06-29 MED ORDER — DEXCOM G7 SENSOR MISC
0 refills | Status: AC
  Filled 2024-06-29: qty 3, 30d supply, fill #0

## 2024-07-08 ENCOUNTER — Other Ambulatory Visit (HOSPITAL_COMMUNITY): Payer: Self-pay

## 2024-11-10 ENCOUNTER — Other Ambulatory Visit (HOSPITAL_COMMUNITY): Payer: Self-pay

## 2024-11-10 DIAGNOSIS — H9193 Unspecified hearing loss, bilateral: Secondary | ICD-10-CM | POA: Diagnosis not present

## 2024-11-10 DIAGNOSIS — F338 Other recurrent depressive disorders: Secondary | ICD-10-CM | POA: Diagnosis not present

## 2024-11-10 DIAGNOSIS — I10 Essential (primary) hypertension: Secondary | ICD-10-CM | POA: Diagnosis not present

## 2024-11-10 MED ORDER — ESCITALOPRAM OXALATE 10 MG PO TABS
10.0000 mg | ORAL_TABLET | Freq: Every day | ORAL | 1 refills | Status: AC
  Filled 2024-11-10 – 2024-12-02 (×3): qty 90, 90d supply, fill #0

## 2024-11-10 MED ORDER — PREDNISONE 20 MG PO TABS
40.0000 mg | ORAL_TABLET | Freq: Every day | ORAL | 0 refills | Status: AC
  Filled 2024-11-10 – 2024-11-11 (×2): qty 10, 5d supply, fill #0

## 2024-11-10 MED ORDER — OLMESARTAN MEDOXOMIL 20 MG PO TABS
20.0000 mg | ORAL_TABLET | Freq: Every day | ORAL | 1 refills | Status: AC
  Filled 2024-11-10 – 2024-12-02 (×3): qty 90, 90d supply, fill #0

## 2024-11-11 ENCOUNTER — Other Ambulatory Visit (HOSPITAL_COMMUNITY): Payer: Self-pay

## 2024-11-13 ENCOUNTER — Other Ambulatory Visit (HOSPITAL_COMMUNITY): Payer: Self-pay

## 2024-11-13 ENCOUNTER — Other Ambulatory Visit: Payer: Self-pay

## 2024-11-25 ENCOUNTER — Other Ambulatory Visit (HOSPITAL_COMMUNITY): Payer: Self-pay

## 2024-11-25 MED ORDER — WEGOVY 1.5 MG PO TABS
1.5000 mg | ORAL_TABLET | Freq: Every day | ORAL | 0 refills | Status: AC
  Filled 2024-11-25: qty 30, 30d supply, fill #0

## 2024-12-02 ENCOUNTER — Other Ambulatory Visit (HOSPITAL_BASED_OUTPATIENT_CLINIC_OR_DEPARTMENT_OTHER): Payer: Self-pay

## 2024-12-02 ENCOUNTER — Other Ambulatory Visit (HOSPITAL_COMMUNITY): Payer: Self-pay

## 2024-12-02 MED ORDER — VITAMIN D (ERGOCALCIFEROL) 1.25 MG (50000 UNIT) PO CAPS
50000.0000 [IU] | ORAL_CAPSULE | ORAL | 1 refills | Status: AC
  Filled 2024-12-02 (×2): qty 12, 84d supply, fill #0

## 2024-12-03 ENCOUNTER — Other Ambulatory Visit (HOSPITAL_BASED_OUTPATIENT_CLINIC_OR_DEPARTMENT_OTHER): Payer: Self-pay

## 2024-12-08 ENCOUNTER — Other Ambulatory Visit (HOSPITAL_COMMUNITY): Payer: Self-pay

## 2024-12-08 ENCOUNTER — Encounter (HOSPITAL_COMMUNITY): Payer: Self-pay

## 2024-12-08 MED ORDER — WEGOVY 4 MG PO TABS
4.0000 mg | ORAL_TABLET | Freq: Every day | ORAL | 0 refills | Status: AC
  Filled 2024-12-09: qty 30, 30d supply, fill #0

## 2024-12-09 ENCOUNTER — Other Ambulatory Visit (HOSPITAL_BASED_OUTPATIENT_CLINIC_OR_DEPARTMENT_OTHER): Payer: Self-pay

## 2024-12-09 ENCOUNTER — Other Ambulatory Visit: Payer: Self-pay
